# Patient Record
Sex: Female | Born: 1945 | Race: White | Hispanic: No | Marital: Married | Smoking: Never smoker
Health system: Southern US, Community
[De-identification: ages and names within clinical notes are randomized; demographics above are authoritative.]

## PROBLEM LIST (undated history)

## (undated) DIAGNOSIS — E039 Hypothyroidism, unspecified: Secondary | ICD-10-CM

## (undated) DIAGNOSIS — R112 Nausea with vomiting, unspecified: Secondary | ICD-10-CM

## (undated) DIAGNOSIS — M1990 Unspecified osteoarthritis, unspecified site: Secondary | ICD-10-CM

## (undated) DIAGNOSIS — K219 Gastro-esophageal reflux disease without esophagitis: Secondary | ICD-10-CM

## (undated) DIAGNOSIS — Z9889 Other specified postprocedural states: Secondary | ICD-10-CM

## (undated) HISTORY — PX: TOTAL THYROIDECTOMY: SHX2547

## (undated) HISTORY — PX: ENDOVENOUS ABLATION SAPHENOUS VEIN W/ LASER: SUR449

## (undated) HISTORY — PX: CHOLECYSTECTOMY: SHX55

## (undated) HISTORY — PX: ABDOMINAL HYSTERECTOMY: SHX81

## (undated) HISTORY — PX: RECTOCELE REPAIR: SHX761

## (undated) HISTORY — PX: SHOULDER ARTHROSCOPY W/ ROTATOR CUFF REPAIR: SHX2400

---

## 2019-04-28 ENCOUNTER — Encounter (INDEPENDENT_AMBULATORY_CARE_PROVIDER_SITE_OTHER): Payer: Self-pay | Admitting: *Deleted

## 2019-05-19 ENCOUNTER — Telehealth (INDEPENDENT_AMBULATORY_CARE_PROVIDER_SITE_OTHER): Payer: Self-pay | Admitting: *Deleted

## 2019-05-19 ENCOUNTER — Encounter (INDEPENDENT_AMBULATORY_CARE_PROVIDER_SITE_OTHER): Payer: Self-pay | Admitting: *Deleted

## 2019-05-19 ENCOUNTER — Encounter (INDEPENDENT_AMBULATORY_CARE_PROVIDER_SITE_OTHER): Payer: Self-pay | Admitting: Nurse Practitioner

## 2019-05-19 ENCOUNTER — Ambulatory Visit (INDEPENDENT_AMBULATORY_CARE_PROVIDER_SITE_OTHER): Payer: Medicare Other | Admitting: Nurse Practitioner

## 2019-05-19 ENCOUNTER — Other Ambulatory Visit: Payer: Self-pay

## 2019-05-19 VITALS — BP 136/85 | HR 88 | Temp 98.1°F | Ht 61.0 in | Wt 135.6 lb

## 2019-05-19 DIAGNOSIS — K219 Gastro-esophageal reflux disease without esophagitis: Secondary | ICD-10-CM | POA: Diagnosis not present

## 2019-05-19 DIAGNOSIS — R1031 Right lower quadrant pain: Secondary | ICD-10-CM | POA: Diagnosis not present

## 2019-05-19 DIAGNOSIS — Z86010 Personal history of colonic polyps: Secondary | ICD-10-CM

## 2019-05-19 DIAGNOSIS — R1310 Dysphagia, unspecified: Secondary | ICD-10-CM

## 2019-05-19 MED ORDER — PEG 3350-KCL-NA BICARB-NACL 420 G PO SOLR: 4000.0000 mL | Freq: Once | ORAL | 0 refills | Status: AC

## 2019-05-19 NOTE — Telephone Encounter (Signed)
Patient needs trilyte TCS sch'd 06/26/19

## 2019-05-19 NOTE — Progress Notes (Addendum)
   Subjective:    Patient ID: Sara Brady, female    DOB: [birth year], 73 y.o.   MRN: [id number]  HPI  Sara Brady is a 73 year old female with a past medical history of aortic regurgitation, HTN, hyperlipidemia, arthritis, chronic left knee pain, hypothyroidism s/p thyroidectomy 03/05/2016 secondary to benign thyroid nodules and colon polyps. Further surgical history includes a cholecystectomy in 2001, right rotator cuff surgery 2002, partial hysterectomy 1991, cystocele and rectocele repair 2017.  She presents today with complaints of RLQ abdominal pain that started 1 year ago. The RLQ pain is more noticeable over the past few months. She describes the pain as a dull ache. Passing a BM reduces this pain. Her bowel movements vary. She has constipation, takes a stool softener daily. She has diarrhea for a few days then no BM for 2 or 3 days then the cycle repeats. She tries to avoid straining which aggravates her bladder. No rectal bleeding or black stools. She complains of having acid regurgitation which is worse when she bends forward. Occasionally has difficulty swallowing pill. She has occasional pain in her esophagus when swallowing solid foods which occurs a few times weekly. Food does not get stuck in the esophagus.  She is taking meloxicam 15 mg once daily for left knee pain. She reports seeing a cardiologist 4 months ago, diagnosed with a mild leaky valve. She has occasional swelling to her legs. She has infrequent SOB with increased activity. No recent SOB. No cough. Chest xray 07/2018 negative. She is a nonsmoker. Infrequent alcohol use.  She reports having 4 colonoscopies in her lifetime. Her first colonoscopy at age 28 was normal. A 2nd colonoscopy was done 9 years later and a large polyp was removed. A repeat colonoscopy was done 1 year later and 1 small polyp was removed. Her  4th and most recent colonoscopy by Dr. Algis Greenhouse was 03/14/2013 which she reported was normal, no polyps.  Mother was  diagnosed with ulcerative colitis at the age of 77, she is living at age 62. Father died at the age of 59 with CAD, bladder cancer with mets.  Review of Systems  See HPI, all other systems reviewed and are negative     Objective:   Physical Exam  Blood pressure 136/85, pulse 88, temperature 98.1 F (36.7 C), height 5\' 1"  (1.549 m), weight 135 lb 9.6 oz (61.5 kg).  General: 73 year old female well-developed in no acute distress Eyes: Sclera nonicteric, conjunctiva pink Mouth: Dentition intact, no ulcers Neck: Supple, no thyromegaly or lymphadenopathy Heart: Regular rate and rhythm, no murmur Lungs: Breath sounds clear throughout Abdomen: Soft, mild tenderness to the right mid abdomen with fullness appreciated, no rebound or guarding, no organomegaly, positive bowel sounds to all 4 quadrants. RUQ scar.  Extremities: Trace lower extremity edema Neuro: Alert and oriented x4, no focal deficits     Assessment & Plan:  79.  73 year old female with acid reflux, intermittent pill dysphasia and odynophagia -Schedule EGD Increase Prevacid to 15 mg 1 tab twice daily  2.  Right lower quadrant abdominal pain with fullness palpated to the right mid abdomen on exam today, this area was mildly tender -Abdominal/pelvic CT with oral contrast only  3.  Constipation -Benefiber 1 tablespoon daily  4. History of colon polyps -Schedule a colonoscopy  Further follow-up to be determined after the above evaluation completed

## 2019-05-19 NOTE — Patient Instructions (Signed)
1. Schedule an abdominal/pelvic CT scan with oral contrast only  2. Schedule an EGD and colonoscopy   3. Increase Prevacid (Lansoprazole) 15 mg one tab twice daily   4. Benefiber 1 tablespoon once daily to regulate your bowel pattern   5. Further follow up to be determined after the above evaluation completed

## 2019-05-20 ENCOUNTER — Encounter (INDEPENDENT_AMBULATORY_CARE_PROVIDER_SITE_OTHER): Payer: Self-pay | Admitting: Nurse Practitioner

## 2019-05-20 DIAGNOSIS — Z86010 Personal history of colonic polyps: Secondary | ICD-10-CM | POA: Insufficient documentation

## 2019-05-20 DIAGNOSIS — E039 Hypothyroidism, unspecified: Secondary | ICD-10-CM | POA: Insufficient documentation

## 2019-06-01 ENCOUNTER — Other Ambulatory Visit: Payer: Self-pay

## 2019-06-01 ENCOUNTER — Ambulatory Visit (HOSPITAL_COMMUNITY)
Admission: RE | Admit: 2019-06-01 | Discharge: 2019-06-01 | Disposition: A | Discharge status: Home / Self Care | Payer: Medicare Other | Source: Ambulatory Visit | Attending: Nurse Practitioner | Admitting: Nurse Practitioner

## 2019-06-01 ENCOUNTER — Other Ambulatory Visit (INDEPENDENT_AMBULATORY_CARE_PROVIDER_SITE_OTHER): Payer: Self-pay | Admitting: Nurse Practitioner

## 2019-06-01 DIAGNOSIS — R1031 Right lower quadrant pain: Secondary | ICD-10-CM | POA: Diagnosis present

## 2019-06-22 ENCOUNTER — Encounter (HOSPITAL_COMMUNITY): Payer: Self-pay

## 2019-06-23 ENCOUNTER — Other Ambulatory Visit (HOSPITAL_COMMUNITY)
Admission: RE | Admit: 2019-06-23 | Discharge: 2019-06-23 | Disposition: A | Discharge status: Home / Self Care | Payer: Medicare Other | Source: Ambulatory Visit | Attending: Internal Medicine | Admitting: Internal Medicine

## 2019-06-23 ENCOUNTER — Encounter (HOSPITAL_COMMUNITY)
Admission: RE | Admit: 2019-06-23 | Discharge: 2019-06-23 | Disposition: A | Discharge status: Home / Self Care | Payer: Medicare Other | Source: Ambulatory Visit | Attending: Internal Medicine | Admitting: Internal Medicine

## 2019-06-23 DIAGNOSIS — Z20828 Contact with and (suspected) exposure to other viral communicable diseases: Secondary | ICD-10-CM | POA: Insufficient documentation

## 2019-06-23 DIAGNOSIS — Z01812 Encounter for preprocedural laboratory examination: Secondary | ICD-10-CM | POA: Insufficient documentation

## 2019-06-23 HISTORY — DX: Gastro-esophageal reflux disease without esophagitis: K21.9

## 2019-06-23 HISTORY — DX: Unspecified osteoarthritis, unspecified site: M19.90

## 2019-06-23 HISTORY — DX: Other specified postprocedural states: Z98.890

## 2019-06-23 HISTORY — DX: Hypothyroidism, unspecified: E03.9

## 2019-06-23 HISTORY — DX: Other specified postprocedural states: R11.2

## 2019-06-23 HISTORY — DX: Nausea with vomiting, unspecified: R11.2

## 2019-06-23 LAB — SARS CORONAVIRUS 2 (TAT 6-24 HRS): SARS Coronavirus 2: NEGATIVE

## 2019-06-26 ENCOUNTER — Ambulatory Visit (HOSPITAL_COMMUNITY)
Admission: RE | Admit: 2019-06-26 | Discharge: 2019-06-26 | Disposition: A | Discharge status: Home / Self Care | Payer: Medicare Other | Attending: Internal Medicine | Admitting: Internal Medicine

## 2019-06-26 ENCOUNTER — Ambulatory Visit (HOSPITAL_COMMUNITY): Payer: Medicare Other | Admitting: Anesthesiology

## 2019-06-26 ENCOUNTER — Encounter (HOSPITAL_COMMUNITY)
Admission: RE | Disposition: A | Discharge status: Home / Self Care | Payer: Self-pay | Source: Home / Self Care | Attending: Internal Medicine

## 2019-06-26 ENCOUNTER — Other Ambulatory Visit: Payer: Self-pay

## 2019-06-26 ENCOUNTER — Encounter (HOSPITAL_COMMUNITY): Payer: Self-pay

## 2019-06-26 DIAGNOSIS — E039 Hypothyroidism, unspecified: Secondary | ICD-10-CM | POA: Diagnosis not present

## 2019-06-26 DIAGNOSIS — Z1211 Encounter for screening for malignant neoplasm of colon: Secondary | ICD-10-CM | POA: Insufficient documentation

## 2019-06-26 DIAGNOSIS — M1990 Unspecified osteoarthritis, unspecified site: Secondary | ICD-10-CM | POA: Diagnosis not present

## 2019-06-26 DIAGNOSIS — K222 Esophageal obstruction: Secondary | ICD-10-CM

## 2019-06-26 DIAGNOSIS — K5900 Constipation, unspecified: Secondary | ICD-10-CM | POA: Diagnosis not present

## 2019-06-26 DIAGNOSIS — Z86010 Personal history of colonic polyps: Secondary | ICD-10-CM

## 2019-06-26 DIAGNOSIS — K644 Residual hemorrhoidal skin tags: Secondary | ICD-10-CM

## 2019-06-26 DIAGNOSIS — K449 Diaphragmatic hernia without obstruction or gangrene: Secondary | ICD-10-CM

## 2019-06-26 DIAGNOSIS — Z79899 Other long term (current) drug therapy: Secondary | ICD-10-CM | POA: Diagnosis not present

## 2019-06-26 DIAGNOSIS — R1314 Dysphagia, pharyngoesophageal phase: Secondary | ICD-10-CM | POA: Insufficient documentation

## 2019-06-26 DIAGNOSIS — Z09 Encounter for follow-up examination after completed treatment for conditions other than malignant neoplasm: Secondary | ICD-10-CM

## 2019-06-26 DIAGNOSIS — Z791 Long term (current) use of non-steroidal anti-inflammatories (NSAID): Secondary | ICD-10-CM | POA: Diagnosis not present

## 2019-06-26 DIAGNOSIS — K317 Polyp of stomach and duodenum: Secondary | ICD-10-CM

## 2019-06-26 DIAGNOSIS — Z79890 Hormone replacement therapy (postmenopausal): Secondary | ICD-10-CM | POA: Insufficient documentation

## 2019-06-26 DIAGNOSIS — K219 Gastro-esophageal reflux disease without esophagitis: Secondary | ICD-10-CM | POA: Insufficient documentation

## 2019-06-26 DIAGNOSIS — R1031 Right lower quadrant pain: Secondary | ICD-10-CM | POA: Insufficient documentation

## 2019-06-26 HISTORY — PX: ESOPHAGOGASTRODUODENOSCOPY (EGD) WITH PROPOFOL: SHX5813

## 2019-06-26 HISTORY — PX: COLONOSCOPY WITH PROPOFOL: SHX5780

## 2019-06-26 HISTORY — PX: POLYPECTOMY: SHX5525

## 2019-06-26 SURGERY — ESOPHAGOGASTRODUODENOSCOPY (EGD) WITH PROPOFOL
Anesthesia: General

## 2019-06-26 MED ORDER — LACTATED RINGERS IV SOLN
INTRAVENOUS | Status: DC
  Administered 2019-06-26: 08:00:00 via INTRAVENOUS

## 2019-06-26 MED ORDER — CHLORHEXIDINE GLUCONATE CLOTH 2 % EX PADS: 6.0000 | MEDICATED_PAD | Freq: Once | CUTANEOUS | Status: DC

## 2019-06-26 MED ORDER — PROPOFOL 10 MG/ML IV BOLUS
INTRAVENOUS | Status: DC | PRN
  Administered 2019-06-26 (×3): 20 mg via INTRAVENOUS

## 2019-06-26 MED ORDER — MIDAZOLAM HCL 2 MG/2ML IJ SOLN: 0.5000 mg | Freq: Once | INTRAMUSCULAR | Status: DC | PRN

## 2019-06-26 MED ORDER — PROPOFOL 10 MG/ML IV BOLUS
INTRAVENOUS | Status: AC
  Filled 2019-06-26: qty 40

## 2019-06-26 MED ORDER — PROMETHAZINE HCL 25 MG/ML IJ SOLN: 6.2500 mg | INTRAMUSCULAR | Status: DC | PRN

## 2019-06-26 MED ORDER — PROPOFOL 500 MG/50ML IV EMUL
INTRAVENOUS | Status: DC | PRN
  Administered 2019-06-26: 150 ug/kg/min via INTRAVENOUS

## 2019-06-26 NOTE — Transfer of Care (Signed)
Immediate Anesthesia Transfer of Care Note  Patient: Sara Brady  Procedure(s) Performed: ESOPHAGOGASTRODUODENOSCOPY (EGD) WITH PROPOFOL (N/A ) COLONOSCOPY WITH PROPOFOL (N/A ) POLYPECTOMY  Patient Location: PACU  Anesthesia Type:General  Level of Consciousness: awake  Airway & Oxygen Therapy: Patient Spontanous Breathing  Post-op Assessment: Report given to RN  Post vital signs: Reviewed  Last Vitals:  Vitals Value Taken Time  BP    Temp    Pulse 117 06/26/19 0848  Resp 15 06/26/19 0849  SpO2 80 % 06/26/19 0848  Vitals shown include unvalidated device data.  Last Pain:  Vitals:   06/26/19 0809  TempSrc:   PainSc: 3       Patients Stated Pain Goal: 7 (40/81/44 8185)  Complications: No apparent anesthesia complications

## 2019-06-26 NOTE — Op Note (Signed)
[facility] Patient Name: Sara Brady Procedure Date: 06/26/2019 7:44 AM MRN: [id number] Date of Birth: [birth year] Attending MD: Lionel December , MD CSN: [id number] Age: 73 Admit Type: Outpatient Procedure:                Colonoscopy Indications:              High risk colon cancer surveillance: Personal                            history of colonic polyps Providers:                Lionel December, MD, Loma Messing B. Patsy Lager, RN, Dyann Ruddle Referring MD:             Alinda Deem, MD Medicines:                Propofol per Anesthesia Complications:            No immediate complications. Estimated Blood Loss:     Estimated blood loss: none. Procedure:                Pre-Anesthesia Assessment:                           - Prior to the procedure, a History and Physical                            was performed, and patient medications and                            allergies were reviewed. The patient's tolerance of                            previous anesthesia was also reviewed. The risks                            and benefits of the procedure and the sedation                            options and risks were discussed with the patient.                            All questions were answered, and informed consent                            was obtained. Prior Anticoagulants: The patient has                            taken no previous anticoagulant or antiplatelet                            agents except for NSAID medication. ASA Grade  Assessment: II - A patient with mild systemic                            disease. After reviewing the risks and benefits,                            the patient was deemed in satisfactory condition to                            undergo the procedure.                           After obtaining informed consent, the colonoscope                            was passed under direct vision. Throughout the                             procedure, the patient's blood pressure, pulse, and                            oxygen saturations were monitored continuously. The                            [id number]-H190DL ([id number]) scope was introduced through                            the anus and advanced to the the cecum, identified                            by appendiceal orifice and ileocecal valve. The                            colonoscopy was performed without difficulty. The                            patient tolerated the procedure well. The quality                            of the bowel preparation was good. The ileocecal                            valve, appendiceal orifice, and rectum were                            photographed. Scope In: 8:28:52 AM Scope Out: 8:41:23 AM Scope Withdrawal Time: 0 hours 7 minutes 39 seconds  Total Procedure Duration: 0 hours 12 minutes 31 seconds  Findings:      Skin tags were found on perianal exam.      The colon (entire examined portion) appeared normal.      External hemorrhoids were found during retroflexion. The hemorrhoids       were small. Impression:               - Perianal skin tags  found on perianal exam.                           - The entire examined colon is normal.                           - External hemorrhoids.                           - No specimens collected. Moderate Sedation:      Per Anesthesia Care Recommendation:           - Patient has a contact number available for                            emergencies. The signs and symptoms of potential                            delayed complications were discussed with the                            patient. Return to normal activities tomorrow.                            Written discharge instructions were provided to the                            patient.                           - High fiber diet today.                           - Continue present medications.                           -  Repeat colonoscopy in 5 years for surveillance. Procedure Code(s):        --- Professional ---                           [phone], Colonoscopy, flexible; diagnostic, including                            collection of specimen(s) by brushing or washing,                            when performed (separate procedure) Diagnosis Code(s):        --- Professional ---                           K64.4, Residual hemorrhoidal skin tags                           Z86.010, Personal history of colonic polyps CPT copyright 2019 American Medical Association. All rights reserved. The codes documented in this report are preliminary and upon coder review may  be revised to meet current compliance requirements. Lionel DecemberNajeeb Jessejames Steelman, MD Ok AnisNajeeb  Laural Golden, MD 06/26/2019 8:56:24 AM This report has been signed electronically. Number of Addenda: 0

## 2019-06-26 NOTE — Discharge Instructions (Signed)
No aspirin or NSAIDs for 24 hours. Resume other medications as before.  Please consider not taking ibuprofen while you are on meloxicam/Mobic. High-fiber diet. No driving for 24 hours. Physician will call with biopsy results.   Colonoscopy, Adult, Care After This sheet gives you information about how to care for yourself after your procedure. Your doctor may also give you more specific instructions. If you have problems or questions, call your doctor. What can I expect after the procedure? After the procedure, it is common to have:  A small amount of blood in your poop for 24 hours.  Some gas.  Mild cramping or bloating in your belly. Follow these instructions at home: General instructions  For the first 24 hours after the procedure: ? Do not drive or use machinery. ? Do not sign important documents. ? Do not drink alcohol. ? Do your daily activities more slowly than normal. ? Eat foods that are soft and easy to digest.  Take over-the-counter or prescription medicines only as told by your doctor. To help cramping and bloating:   Try walking around.  Put heat on your belly (abdomen) as told by your doctor. Use a heat source that your doctor recommends, such as a moist heat pack or a heating pad. ? Put a towel between your skin and the heat source. ? Leave the heat on for 20-30 minutes. ? Remove the heat if your skin turns bright red. This is especially important if you cannot feel pain, heat, or cold. You can get burned. Eating and drinking   Drink enough fluid to keep your pee (urine) clear or pale yellow.  Return to your normal diet as told by your doctor. Avoid heavy or fried foods that are hard to digest.  Avoid drinking alcohol for as long as told by your doctor. Contact a doctor if:  You have blood in your poop (stool) 2-3 days after the procedure. Get help right away if:  You have more than a small amount of blood in your poop.  You see large clumps of tissue  (blood clots) in your poop.  Your belly is swollen.  You feel sick to your stomach (nauseous).  You throw up (vomit).  You have a fever.  You have belly pain that gets worse, and medicine does not help your pain. Summary  After the procedure, it is common to have a small amount of blood in your poop. You may also have mild cramping and bloating in your belly.  For the first 24 hours after the procedure, do not drive or use machinery, do not sign important documents, and do not drink alcohol.  Get help right away if you have a lot of blood in your poop, feel sick to your stomach, have a fever, or have more belly pain. This information is not intended to replace advice given to you by your health care provider. Make sure you discuss any questions you have with your health care provider. Document Released: 10/20/2010 Document Revised: 07/18/2017 Document Reviewed: 06/11/2016 Elsevier Patient Education  2020 [employer]   Monitored Anesthesia Care, Care After These instructions provide you with information about caring for yourself after your procedure. Your health care provider may also give you more specific instructions. Your treatment has been planned according to current medical practices, but problems sometimes occur. Call your health care provider if you have any problems or questions after your procedure. What can I expect after the procedure? After your procedure, you may:  Feel sleepy  for several hours.  Feel clumsy and have poor balance for several hours.  Feel forgetful about what happened after the procedure.  Have poor judgment for several hours.  Feel nauseous or vomit.  Have a sore throat if you had a breathing tube during the procedure. Follow these instructions at home: For at least 24 hours after the procedure:      Have a responsible adult stay with you. It is important to have someone help care for you until you are awake and alert.  Rest as  needed.  Do not: ? Participate in activities in which you could fall or become injured. ? Drive. ? Use heavy machinery. ? Drink alcohol. ? Take sleeping pills or medicines that cause drowsiness. ? Make important decisions or sign legal documents. ? Take care of children on your own. Eating and drinking  Follow the diet that is recommended by your health care provider.  If you vomit, drink water, juice, or soup when you can drink without vomiting.  Make sure you have little or no nausea before eating solid foods. General instructions  Take over-the-counter and prescription medicines only as told by your health care provider.  If you have sleep apnea, surgery and certain medicines can increase your risk for breathing problems. Follow instructions from your health care provider about wearing your sleep device: ? Anytime you are sleeping, including during daytime naps. ? While taking prescription pain medicines, sleeping medicines, or medicines that make you drowsy.  If you smoke, do not smoke without supervision.  Keep all follow-up visits as told by your health care provider. This is important. Contact a health care provider if:  You keep feeling nauseous or you keep vomiting.  You feel light-headed.  You develop a rash.  You have a fever. Get help right away if:  You have trouble breathing. Summary  For several hours after your procedure, you may feel sleepy and have poor judgment.  Have a responsible adult stay with you for at least 24 hours or until you are awake and alert. This information is not intended to replace advice given to you by your health care provider. Make sure you discuss any questions you have with your health care provider. Document Released: 01/08/2016 Document Revised: 12/16/2017 Document Reviewed: 01/08/2016 Elsevier Patient Education  2020 Elsevier Inc.    Upper Endoscopy, Adult, Care After This sheet gives you information about how to care  for yourself after your procedure. Your health care provider may also give you more specific instructions. If you have problems or questions, contact your health care provider. What can I expect after the procedure? After the procedure, it is common to have:  A sore throat.  Mild stomach pain or discomfort.  Bloating.  Nausea. Follow these instructions at home:   Follow instructions from your health care provider about what to eat or drink after your procedure.  Return to your normal activities as told by your health care provider. Ask your health care provider what activities are safe for you.  Take over-the-counter and prescription medicines only as told by your health care provider.  Do not drive for 24 hours if you were given a sedative during your procedure.  Keep all follow-up visits as told by your health care provider. This is important. Contact a health care provider if you have:  A sore throat that lasts longer than one day.  Trouble swallowing. Get help right away if:  You vomit blood or your vomit looks like coffee  grounds.  You have: ? A fever. ? Bloody, black, or tarry stools. ? A severe sore throat or you cannot swallow. ? Difficulty breathing. ? Severe pain in your chest or abdomen. Summary  After the procedure, it is common to have a sore throat, mild stomach discomfort, bloating, and nausea.  Do not drive for 24 hours if you were given a sedative during the procedure.  Follow instructions from your health care provider about what to eat or drink after your procedure.  Return to your normal activities as told by your health care provider. This information is not intended to replace advice given to you by your health care provider. Make sure you discuss any questions you have with your health care provider. Document Released: 03/18/2012 Document Revised: 03/11/2018 Document Reviewed: 02/17/2018 Elsevier Patient Education  2020 [employer].

## 2019-06-26 NOTE — Op Note (Signed)
[facility] Patient Name: Sara Brady Procedure Date: 06/26/2019 7:49 AM MRN: [id number] Date of Birth: 08-27-1946 Attending MD: Lionel December , MD CSN: [id number] Age: 73 Admit Type: Outpatient Procedure:                Upper GI endoscopy Indications:              Esophageal dysphagia, Odynophagia Providers:                Lionel December, MD, Brain Hilts RN, RN, Dyann Ruddle Referring MD:             Alinda Deem, MD Medicines:                Propofol per Anesthesia Complications:            No immediate complications. Estimated Blood Loss:     Estimated blood loss was minimal. Procedure:                Pre-Anesthesia Assessment:                           - Prior to the procedure, a History and Physical                            was performed, and patient medications and                            allergies were reviewed. The patient's tolerance of                            previous anesthesia was also reviewed. The risks                            and benefits of the procedure and the sedation                            options and risks were discussed with the patient.                            All questions were answered, and informed consent                            was obtained. Prior Anticoagulants: The patient has                            taken no previous anticoagulant or antiplatelet                            agents except for NSAID medication. ASA Grade                            Assessment: II - A patient with mild systemic                            disease. After reviewing the risks and benefits,  the patient was deemed in satisfactory condition to                            undergo the procedure.                           After obtaining informed consent, the endoscope was                            passed under direct vision. Throughout the                            procedure, the patient's blood pressure, pulse, and                       oxygen saturations were monitored continuously. The                            [id number]-[id number] ([id number] scope was introduced through the                            mouth, and advanced to the second part of duodenum.                            The upper GI endoscopy was accomplished without                            difficulty. The patient tolerated the procedure                            well. Scope In: 8:15:16 AM Scope Out: 8:24:43 AM Total Procedure Duration: 0 hours 9 minutes 27 seconds  Findings:      The examined esophagus was normal.      A mild Schatzki ring was found at the gastroesophageal junction. The       scope was withdrawn. Dilation was performed with a Maloney dilator with       no resistance at 54 Fr. The dilation site was examined following       endoscope reinsertion and showed no change and no bleeding, mucosal tear       or perforation. Ring was disrupted with focal biopsy.      A 4 cm hiatal hernia was present.      A single small sessile polyp with no stigmata of recent bleeding was       found in the gastric body. Biopsies were taken with a cold forceps for       histology. The pathology specimen was placed into Bottle Number 1.      The exam of the stomach was otherwise normal.      The duodenal bulb and second portion of the duodenum were normal. Impression:               - Normal esophagus.                           - Mild Schatzki ring. Dilated. Ring was stiill  intac; therefore disrupted with focal biopsy but                            this tissue was not saved.                           - 4 cm hiatal hernia.                           - A single gastric polyp. Biopsied.                           - Normal duodenal bulb and second portion of the                            duodenum. Moderate Sedation:      Per Anesthesia Care Recommendation:           - Patient has a contact number available for                             emergencies. The signs and symptoms of potential                            delayed complications were discussed with the                            patient. Return to normal activities tomorrow.                            Written discharge instructions were provided to the                            patient.                           - Resume previous diet today.                           - Continue present medications.                           - No aspirin, ibuprofen, naproxen, or other                            non-steroidal anti-inflammatory drugs for 1 day.                           - Await pathology results.                           - Telephone GI clinic in 1 week. Procedure Code(s):        --- Professional ---                           [phone], Esophagogastroduodenoscopy, flexible,  transoral; with biopsy, single or multiple                           43450, Dilation of esophagus, by unguided sound or                            bougie, single or multiple passes Diagnosis Code(s):        --- Professional ---                           K22.2, Esophageal obstruction                           K44.9, Diaphragmatic hernia without obstruction or                            gangrene                           K31.7, Polyp of stomach and duodenum                           R13.14, Dysphagia, pharyngoesophageal phase CPT copyright 2019 American Medical Association. All rights reserved. The codes documented in this report are preliminary and upon coder review may  be revised to meet current compliance requirements. Hildred Laser, MD Hildred Laser, MD 06/26/2019 8:52:51 AM This report has been signed electronically. Number of Addenda: 0

## 2019-06-26 NOTE — H&P (Signed)
Sara Brady is an 73 y.o. female.   Chief Complaint: Patient is here for cervical gastroduodenoscopy with esophageal dilation and colonoscopy. HPI: Patient is 73 year old Caucasian female with history of GERD who presents with over a year history of intermittent dysphagia to solids and pills particularly large pills.  She points to suprasternal area*bolus obstruction.  She also reports discomfort as the bolus of pills go down and may have epigastric pain for a short duration.  She has not had any episode of food impaction.  She also has a history of colonic polyps.  This is her fourth colonoscopy.  She had a large bleeding polyp removed but no polyps were found on her most recent colonoscopy of 2014.  She has constipation.  She also has left-sided abdominal pain felt to be due to constipation and her IBS.  She had abdominal pelvic CT recently which was unremarkable except stool in her colon. Family history significant for UC in her mother who was diagnosed at age 88 and now not doing well at 100 in a nursing home.  Past Medical History:  Diagnosis Date  . Arthritis   . GERD (gastroesophageal reflux disease)   . Hypothyroidism   . PONV (postoperative nausea and vomiting)     Past Surgical History:  Procedure Laterality Date  . ABDOMINAL HYSTERECTOMY    . CHOLECYSTECTOMY    . ENDOVENOUS ABLATION SAPHENOUS VEIN W/ LASER    . RECTOCELE REPAIR    . SHOULDER ARTHROSCOPY W/ ROTATOR CUFF REPAIR Right   . TOTAL THYROIDECTOMY      History reviewed. No pertinent family history. Social History:  reports that she has never smoked. She has never used smokeless tobacco. She reports current alcohol use of about 1.0 standard drinks of alcohol per week. She reports that she does not use drugs.  Allergies:  Allergies  Allergen Reactions  . Septra [Sulfamethoxazole-Trimethoprim] Shortness Of Breath and Rash  . Augmentin [Amoxicillin-Pot Clavulanate] Hives  . Codeine Nausea And Vomiting  . Levaquin  [Levofloxacin]   . Ceclor [Cefaclor] Rash    Medications Prior to Admission  Medication Sig Dispense Refill  . Cholecalciferol (VITAMIN D) 50 MCG (2000 UT) tablet Take 2,000 Units by mouth daily.    Marland Kitchen docusate sodium (COLACE) 100 MG capsule Take 100 mg by mouth daily.    . fexofenadine (ALLEGRA) 180 MG tablet Take 180 mg by mouth daily.    Marland Kitchen ibuprofen (ADVIL) 200 MG tablet Take 200 mg by mouth every 8 (eight) hours as needed (pain).    Marland Kitchen lansoprazole (PREVACID) 15 MG capsule Take 15 mg by mouth 2 (two) times daily before a meal.     . levothyroxine (SYNTHROID) 75 MCG tablet Take 75 mcg by mouth daily before breakfast.    . lovastatin (MEVACOR) 20 MG tablet Take 20 mg by mouth at bedtime.    . meloxicam (MOBIC) 15 MG tablet Take 7.5-15 mg by mouth daily.     . NONFORMULARY OR COMPOUNDED ITEM Place 1 application vaginally 2 (two) times a week. Estradiol cream; Vit E: 0.1% - 100 units    . Omega-3 Fatty Acids (FISH OIL) 1000 MG CAPS Take 1,000 mg by mouth 2 (two) times daily.    . vitamin C (ASCORBIC ACID) 500 MG tablet Take 500 mg by mouth daily.    . Zinc 25 MG TABS Take 25 mg by mouth daily.      No results found for this or any previous visit (from the past 48 hour(s)). No results found.  ROS  Blood pressure (!) 150/79, pulse 70, temperature 98.4 F (36.9 C), temperature source Oral, resp. rate 16, SpO2 95 %. Physical Exam  Constitutional:  Well-developed thin Caucasian female in NAD.  HENT:  Mouth/Throat: Oropharynx is clear and moist.  Eyes: Conjunctivae are normal. No scleral icterus.  Neck: No thyromegaly present.  Thyroid collar scar.  Cardiovascular: Normal rate, regular rhythm and normal heart sounds.  No murmur heard. Respiratory: Effort normal and breath sounds normal.  GI:  Abdomen is flat.  Laparoscopy scars noted.  Abdomen is soft and nontender with organomegaly or masses  Musculoskeletal:        General: No edema.  Lymphadenopathy:    She has no cervical  adenopathy.  Neurological: She is alert.  Skin: Skin is warm and dry.     Assessment/Plan Esophageal dysphagia. History of colonic polyps. Esophagogastroduodenoscopy with esophageal dilation followed by surveillance colonoscopy.  Lionel December, MD 06/26/2019, 8:02 AM

## 2019-06-26 NOTE — Anesthesia Preprocedure Evaluation (Signed)
Anesthesia Evaluation  Patient identified by MRN, date of birth, ID band Patient awake  General Assessment Comment:States personal h/o PONV remotely  States hasn't had any issues more recent ly   Reviewed: Allergy & Precautions, NPO status , Patient's Chart, lab work & pertinent test results  History of Anesthesia Complications (+) PONV and history of anesthetic complications  Airway Mallampati: I  TM Distance: >3 FB Neck ROM: Full    Dental no notable dental hx. (+) Teeth Intact   Pulmonary shortness of breath, at rest and lying,    Pulmonary exam normal breath sounds clear to auscultation       Cardiovascular Exercise Tolerance: Good negative cardio ROS Normal cardiovascular examI Rhythm:Regular Rate:Normal  Reports Good ET, prior to knee issues  Denies CP Reports SOB lying flat which improves with position change  States had a cardiac w/u this year-revealing mod Aortic insufficiency- No recent ecg or Echo in our system  Denies any CHF issues    Neuro/Psych negative neurological ROS  negative psych ROS   GI/Hepatic Neg liver ROS, GERD  Medicated and Controlled,  Endo/Other  Hypothyroidism   Renal/GU negative Renal ROS  negative genitourinary   Musculoskeletal  (+) Arthritis , Osteoarthritis,    Abdominal   Peds negative pediatric ROS (+)  Hematology negative hematology ROS (+)   Anesthesia Other Findings   Reproductive/Obstetrics negative OB ROS                             Anesthesia Physical Anesthesia Plan  ASA: II  Anesthesia Plan: General   Post-op Pain Management:    Induction: Intravenous  PONV Risk Score and Plan: 4 or greater and TIVA, Propofol infusion, Ondansetron, Dexamethasone and Treatment may vary due to age or medical condition  Airway Management Planned: Nasal Cannula and Simple Face Mask  Additional Equipment:   Intra-op Plan:   Post-operative Plan:    Informed Consent: I have reviewed the patients History and Physical, chart, labs and discussed the procedure including the risks, benefits and alternatives for the proposed anesthesia with the patient or authorized representative who has indicated his/her understanding and acceptance.     Dental advisory given  Plan Discussed with: CRNA  Anesthesia Plan Comments: (Plan Full PPE use  Plan GA with GETA as needed d/w pt -WTP with same after Q&A)        Anesthesia Quick Evaluation

## 2019-06-26 NOTE — Anesthesia Postprocedure Evaluation (Signed)
Anesthesia Post Note  Patient: Sara Brady  Procedure(s) Performed: ESOPHAGOGASTRODUODENOSCOPY (EGD) WITH PROPOFOL (N/A ) COLONOSCOPY WITH PROPOFOL (N/A ) POLYPECTOMY  Patient location during evaluation: PACU Anesthesia Type: General Level of consciousness: awake and alert and oriented Pain management: pain level controlled Vital Signs Assessment: post-procedure vital signs reviewed and stable Respiratory status: spontaneous breathing Cardiovascular status: blood pressure returned to baseline and stable Postop Assessment: no headache Anesthetic complications: no     Last Vitals:  Vitals:   06/26/19 0715  BP: (!) 150/79  Pulse: 70  Resp: 16  Temp: 36.9 C  SpO2: 95%    Last Pain:  Vitals:   06/26/19 0809  TempSrc:   PainSc: 3                  Lidiya Reise

## 2019-06-26 NOTE — Addendum Note (Signed)
Addendum  created 06/26/19 0911 by Ollen Bowl, CRNA   Charge Capture section accepted

## 2019-06-29 LAB — SURGICAL PATHOLOGY

## 2019-07-01 ENCOUNTER — Encounter (HOSPITAL_COMMUNITY): Payer: Self-pay | Admitting: Internal Medicine

## 2019-07-04 ENCOUNTER — Other Ambulatory Visit (INDEPENDENT_AMBULATORY_CARE_PROVIDER_SITE_OTHER): Payer: Self-pay | Admitting: Internal Medicine

## 2019-07-04 MED ORDER — LANSOPRAZOLE 30 MG PO CPDR: 30.0000 mg | DELAYED_RELEASE_CAPSULE | Freq: Every day | ORAL | 5 refills | Status: DC

## 2020-09-13 ENCOUNTER — Other Ambulatory Visit (INDEPENDENT_AMBULATORY_CARE_PROVIDER_SITE_OTHER): Payer: Self-pay

## 2020-09-13 ENCOUNTER — Telehealth (INDEPENDENT_AMBULATORY_CARE_PROVIDER_SITE_OTHER): Payer: Self-pay | Admitting: Internal Medicine

## 2020-09-13 MED ORDER — LANSOPRAZOLE 30 MG PO CPDR: 30.0000 mg | DELAYED_RELEASE_CAPSULE | Freq: Every day | ORAL | 1 refills | Status: AC

## 2020-09-13 NOTE — Telephone Encounter (Signed)
Per Dr. Karilyn Cota ok to send in the medication for 90 days worth and one refill,but patient will need an office visit for further refills, this was noted on the prescription and also patient was instructed that she will have to set up an office visit in order to get further refills.

## 2020-09-13 NOTE — Telephone Encounter (Signed)
Patient left message stating she needs a new prescription for a 90 day supply of her medication - didn't say what medication - ph# [phone]

## 2020-09-13 NOTE — Telephone Encounter (Signed)
I spoke with the patient and she wanted her rx for Lantoprazole sent to [facility] at [place] for a 90 day supply. I have placed a note on Dr. Patty Sermons desk asking if this was ok.

## 2020-10-13 IMAGING — CT CT ABDOMEN AND PELVIS WITHOUT CONTRAST
2 of 3 series · 16 of 46 positions shown, 18 images · non-contrast
Comparison: None.

CLINICAL DATA: Right lower and mid abdominal pain, question
diverticulitis

EXAM:
CT ABDOMEN AND PELVIS WITHOUT CONTRAST
TECHNIQUE: Multidetector CT imaging of the abdomen and pelvis was performed
following the standard protocol without IV contrast. Oral contrast
was received

[Series 2: axial st · axial · 0.60mm/px · z∈[-477,-147]mm · 13 of 76 slices shown, 15 images]
[im 5/76  soft-tissue]
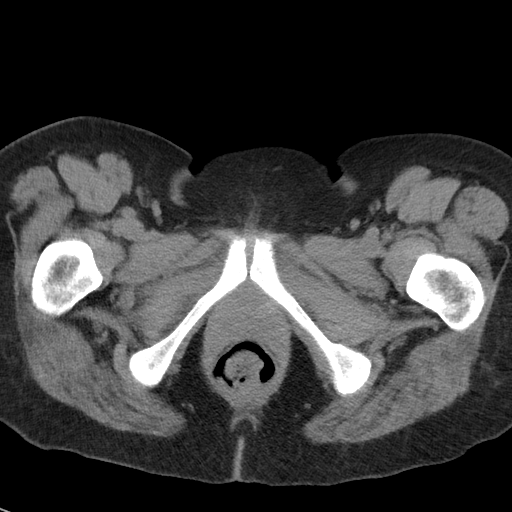
[im 5/76  bone]
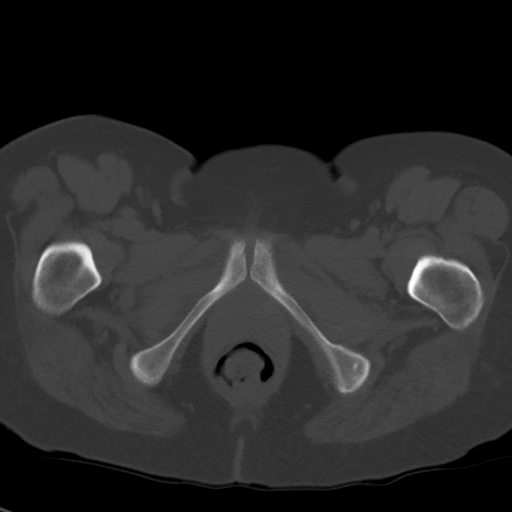
[im 10/76  soft-tissue]
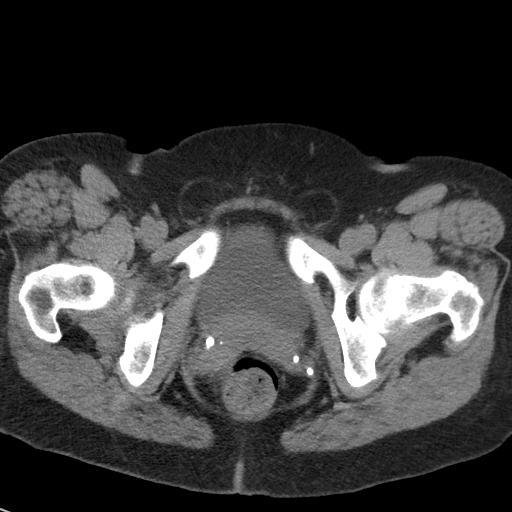
[im 15/76  soft-tissue]
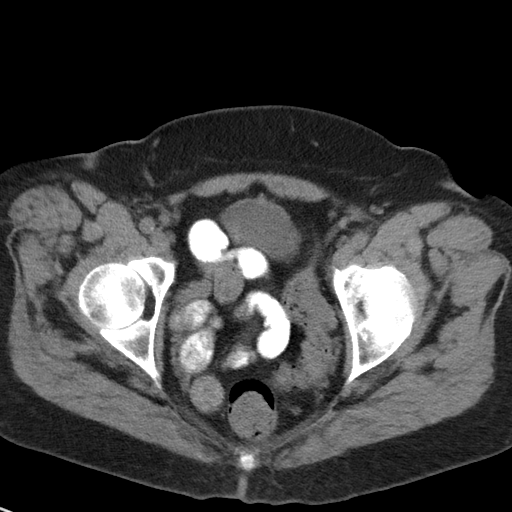
[im 22/76  soft-tissue]
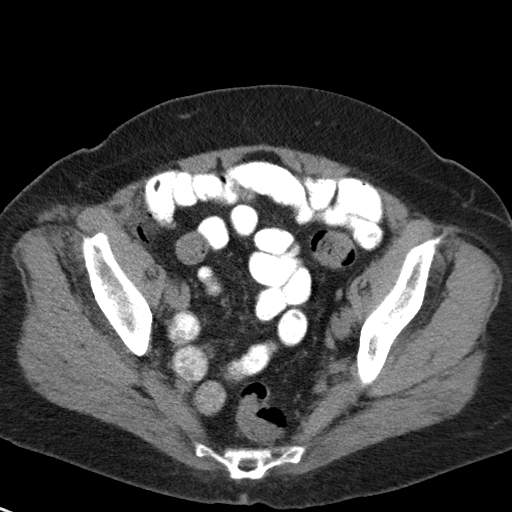
[im 27/76  soft-tissue]
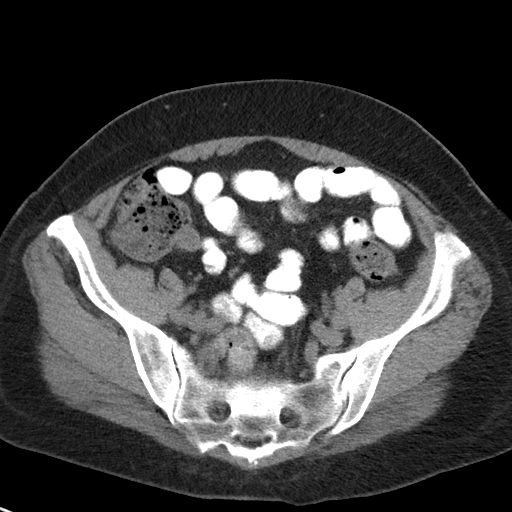
[im 32/76  soft-tissue]
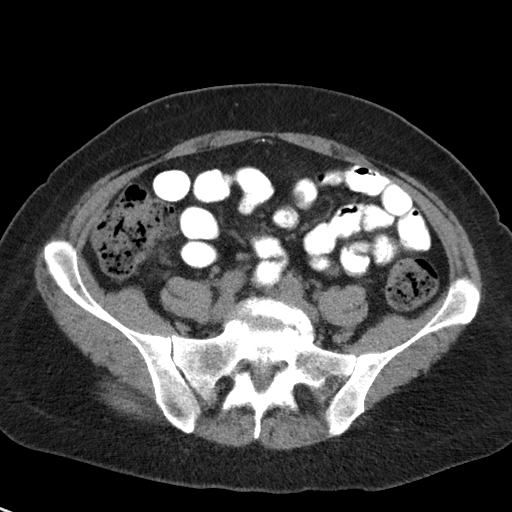
[im 39/76  soft-tissue]
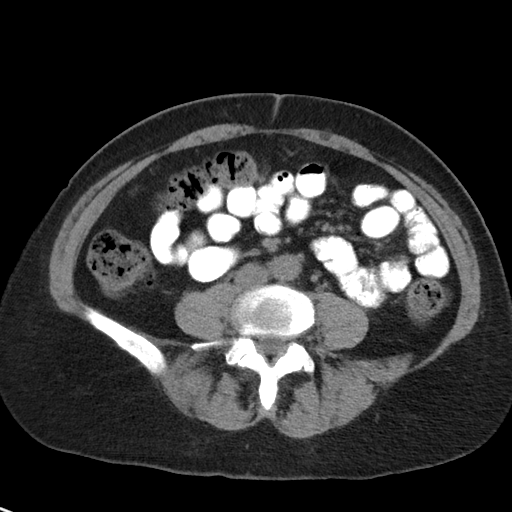
[im 44/76  soft-tissue]
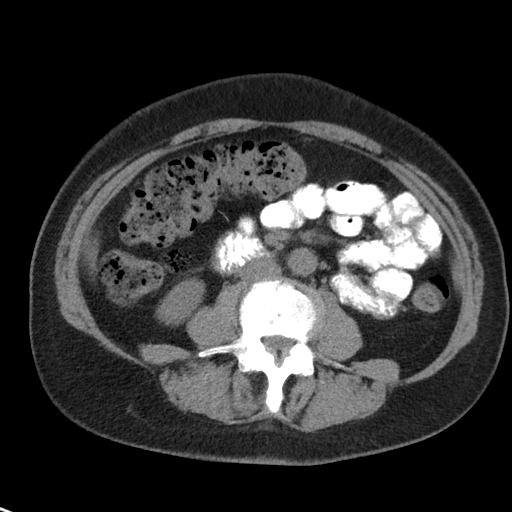
[im 49/76  soft-tissue]
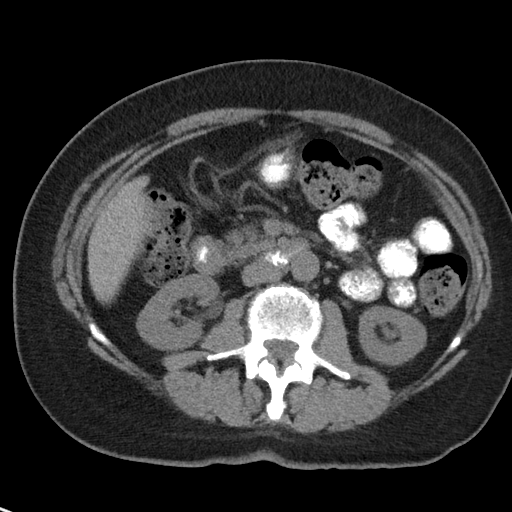
[im 49/76  bone]
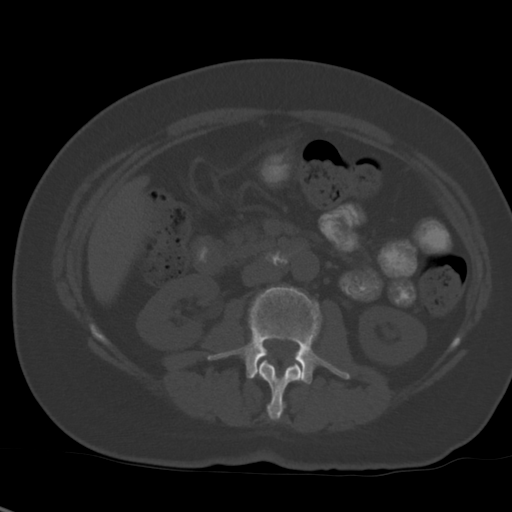
[im 54/76  soft-tissue]
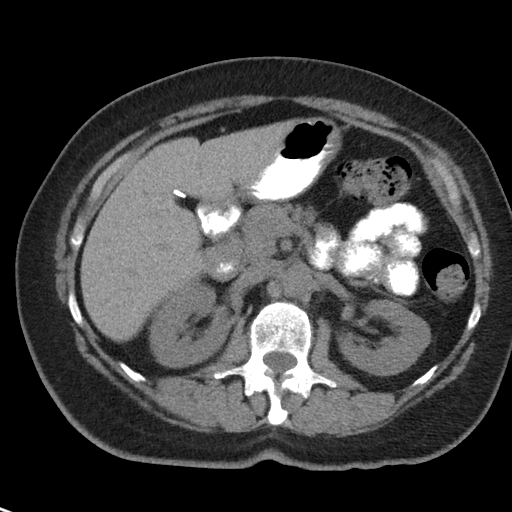
[im 61/76  soft-tissue]
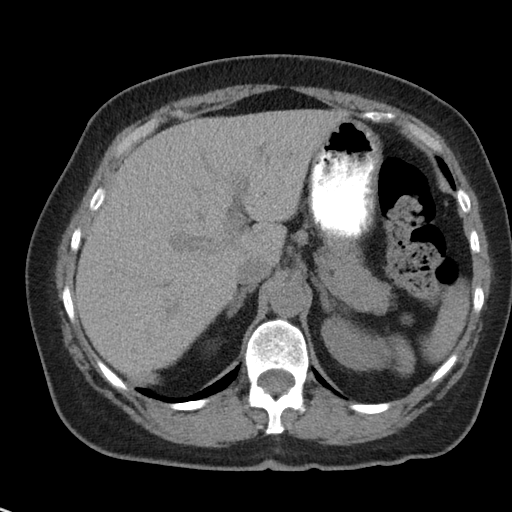
[im 66/76  soft-tissue]
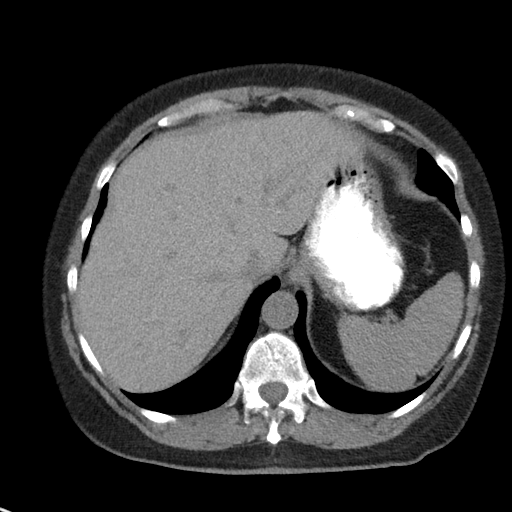
[im 71/76  soft-tissue]
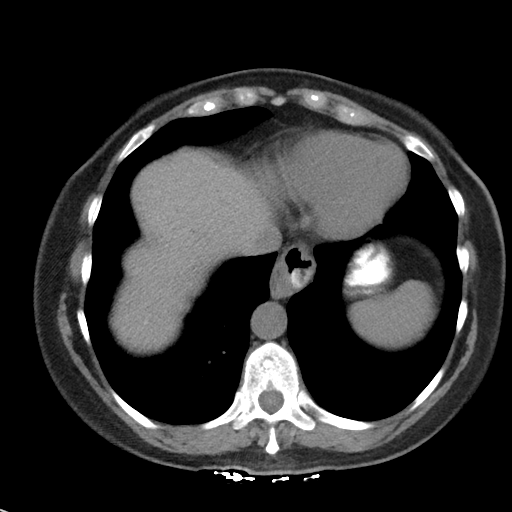

[Series 5: coronal st · coronal · 0.58mm/px · 3 of 78 slices shown]
[im 26/78  soft-tissue]
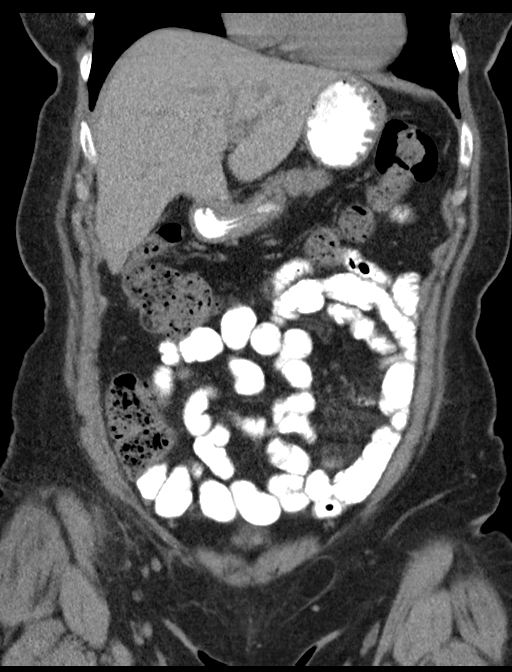
[im 35/78  soft-tissue]
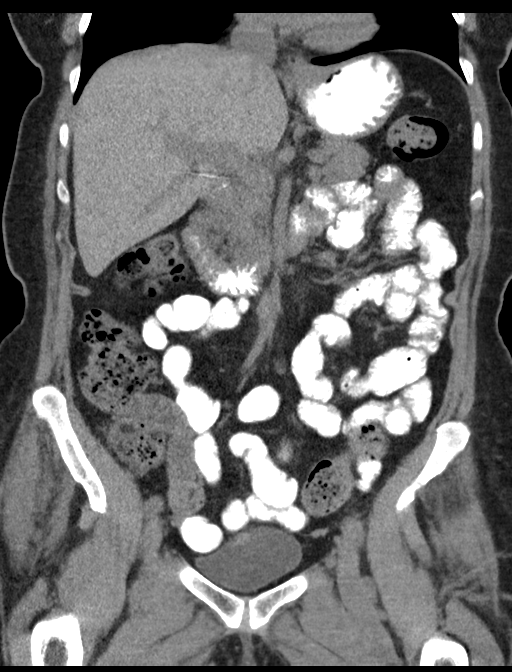
[im 43/78  soft-tissue]
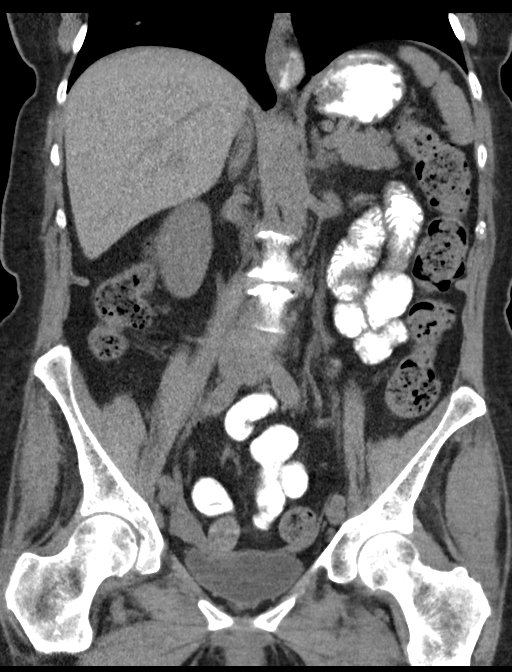

[16 of 46 positions shown; findings below may reference images not displayed]

FINDINGS: Lower chest: The visualized heart size within normal limits. No
pericardial fluid/thickening.

A small hiatal hernia is present.

The visualized portions of the lungs are clear.

Hepatobiliary: Although limited due to the lack of intravenous
contrast, normal in appearance without gross focal abnormality. The
patient is status post cholecystectomy. No biliary ductal dilation.

Pancreas:  Unremarkable.  No surrounding inflammatory changes.

Spleen: Normal in size. Although limited due to the lack of
intravenous contrast, normal in appearance.

Adrenals/Urinary Tract: Both adrenal glands appear normal. The
kidneys and collecting system appear normal without evidence of
urinary tract calculus or hydronephrosis. Bladder is unremarkable.

Stomach/Bowel: The stomach and small bowel are normal in appearance.
There is a moderate to large amount of colonic stool seen throughout
especially within the cecum. There appears to be a small amount of
fecalization of the terminal ileum. Air is seen in a nondilated
appendix. No surrounding pericolonic free fluid or inflammatory
changes are noted. No focal area of bowel wall thickening.

Vascular/Lymphatic: There are no enlarged abdominal or pelvic lymph
nodes. No significant gross vascular findings are present.

Reproductive: The patient is status post hysterectomy. No adnexal
masses or collections seen.

Other: No evidence of abdominal wall mass or hernia.

Musculoskeletal: No acute or significant osseous findings.
Degenerative changes are most notable L3-L4.
IMPRESSION: Moderate to large amount of colonic stool with fecalization of the
terminal ileum. No evidence of diverticulitis.

Small hiatal hernia.

## 2024-05-13 ENCOUNTER — Encounter (INDEPENDENT_AMBULATORY_CARE_PROVIDER_SITE_OTHER): Payer: Self-pay | Admitting: *Deleted

## 2024-05-27 ENCOUNTER — Telehealth: Payer: Self-pay | Admitting: *Deleted

## 2024-05-27 NOTE — Telephone Encounter (Signed)
 Pt left VM. Received TCS recall letter. She reports having dysphagia and thinks she might also need EGD at the same time. Can you schedule OV to discuss

## 2024-06-22 ENCOUNTER — Ambulatory Visit (INDEPENDENT_AMBULATORY_CARE_PROVIDER_SITE_OTHER): Admitting: Gastroenterology

## 2024-06-29 ENCOUNTER — Ambulatory Visit (INDEPENDENT_AMBULATORY_CARE_PROVIDER_SITE_OTHER): Admitting: Gastroenterology

## 2024-07-07 ENCOUNTER — Ambulatory Visit (INDEPENDENT_AMBULATORY_CARE_PROVIDER_SITE_OTHER): Admitting: Gastroenterology

## 2024-08-11 ENCOUNTER — Encounter (INDEPENDENT_AMBULATORY_CARE_PROVIDER_SITE_OTHER): Payer: Self-pay | Admitting: Gastroenterology

## 2024-10-07 ENCOUNTER — Ambulatory Visit (INDEPENDENT_AMBULATORY_CARE_PROVIDER_SITE_OTHER): Admitting: Gastroenterology

## 2024-10-07 ENCOUNTER — Encounter (INDEPENDENT_AMBULATORY_CARE_PROVIDER_SITE_OTHER): Payer: Self-pay | Admitting: Gastroenterology

## 2024-10-07 VITALS — BP 167/73 | HR 73 | Temp 98.2°F | Ht 60.0 in | Wt 132.1 lb

## 2024-10-07 DIAGNOSIS — R1310 Dysphagia, unspecified: Secondary | ICD-10-CM | POA: Diagnosis not present

## 2024-10-07 DIAGNOSIS — Z860100 Personal history of colon polyps, unspecified: Secondary | ICD-10-CM

## 2024-10-07 DIAGNOSIS — K219 Gastro-esophageal reflux disease without esophagitis: Secondary | ICD-10-CM | POA: Diagnosis not present

## 2024-10-07 DIAGNOSIS — R194 Change in bowel habit: Secondary | ICD-10-CM

## 2024-10-07 DIAGNOSIS — R1319 Other dysphagia: Secondary | ICD-10-CM | POA: Insufficient documentation

## 2024-10-07 DIAGNOSIS — R197 Diarrhea, unspecified: Secondary | ICD-10-CM | POA: Insufficient documentation

## 2024-10-07 DIAGNOSIS — K5904 Chronic idiopathic constipation: Secondary | ICD-10-CM | POA: Insufficient documentation

## 2024-10-07 DIAGNOSIS — I10 Essential (primary) hypertension: Secondary | ICD-10-CM | POA: Insufficient documentation

## 2024-10-07 MED ORDER — POLYETHYLENE GLYCOL 3350 17 G PO PACK: 17.0000 g | PACK | Freq: Two times a day (BID) | ORAL | 0 refills | Status: AC

## 2024-10-07 MED ORDER — PSYLLIUM 58.6 % PO PACK: 1.0000 | PACK | Freq: Two times a day (BID) | ORAL | 2 refills | Status: AC

## 2024-10-07 NOTE — Progress Notes (Signed)
 Lionel Woodberry Faizan Yuko Coventry , M.D. Gastroenterology & Hepatology [facility] [address] [place], [place] [address] Primary Care Physician: Diedra Senior, MD [address] [place] [address]  Chief Complaint: Dysphagia  History of Present Illness: Sara Brady is a 79 y.o. female with aortic regurgitation, HTN, hyperlipidemia, arthritis, chronic left knee pain, hypothyroidism s/p thyroidectomy  , history of large colon polyps removed who presents for evaluation of solid food dysphagia and colonoscopy with history of colon polyps  Patient was last seen in our clinic in 2020 as she was a patient of Dr. Golda , underwent upper endoscopy for dysphagia with dilation of Schatzki's ring and colonoscopy 2020  Patient reports that she will have solid food getting stuck in the middle of her chest and would go down with some pain.  Patient has been taking Prevacid  and if she does not take it she will have symptoms of GERD.  Reports after dilation in 2020 her symptoms significantly improved but recently have come back The patient denies having any nausea, vomiting, fever, chills, hematochezia, melena, hematemesis, abdominal distention, abdominal pain, diarrhea, jaundice, pruritus or weight loss.  Last EGD:06/2019  - Normal esophagus. - Mild Schatzki ring. Dilated. Ring was stiill intac; therefore disrupted with focal biopsy but this tissue was not saved. - 4 cm hiatal hernia. - A single gastric polyp. Biopsied. - Normal duodenal bulb and second portion of the duodenum.  Last Colonoscopy:06/2019 - Perianal skin tags found on perianal exam. - The entire examined colon is normal. - External hemorrhoids. - No specimens collected.  Repeat 5 years   FHx: Mother was diagnosed with ulcerative colitis age 20  Most recent labs 10/06/2024 normal liver enzymes hemoglobin 16 platelet 326  Past Medical History: Past Medical History:  Diagnosis Date   Arthritis    GERD  (gastroesophageal reflux disease)    Hypothyroidism    PONV (postoperative nausea and vomiting)     Past Surgical History: Past Surgical History:  Procedure Laterality Date   ABDOMINAL HYSTERECTOMY     CHOLECYSTECTOMY     COLONOSCOPY WITH PROPOFOL  N/A 06/26/2019   Procedure: COLONOSCOPY WITH PROPOFOL ;  Surgeon: Golda Claudis PENNER, MD;  [facility]: [facility];  Service: Endoscopy;  Laterality: N/A;   ENDOVENOUS ABLATION SAPHENOUS VEIN W/ LASER     ESOPHAGOGASTRODUODENOSCOPY (EGD) WITH PROPOFOL  N/A 06/26/2019   Procedure: ESOPHAGOGASTRODUODENOSCOPY (EGD) WITH PROPOFOL ;  Surgeon: Golda Claudis PENNER, MD;  Location: [facility];  Service: Endoscopy;  Laterality: N/A;  8:15   POLYPECTOMY  06/26/2019   Procedure: POLYPECTOMY;  Surgeon: Golda Claudis PENNER, MD;  Location: [facility];  Service: Endoscopy;;  gastric   RECTOCELE REPAIR     SHOULDER ARTHROSCOPY W/ ROTATOR CUFF REPAIR Right    TOTAL THYROIDECTOMY      Family History:History reviewed. No pertinent family history.  Social History:Tobacco Use History[1] Social History   Substance and Sexual Activity  Alcohol Use Yes   Alcohol/week: 1.0 standard drink of alcohol   Types: 1 Glasses of wine per week   Comment: one every    Social History   Substance and Sexual Activity  Drug Use Never    Allergies: Allergies[2]  Medications: Current Outpatient Medications  Medication Sig Dispense Refill   Cholecalciferol (VITAMIN D) 50 MCG (2000 UT) tablet Take 2,000 Units by mouth daily.     docusate sodium (COLACE) 100 MG capsule Take 100 mg by mouth daily.     fexofenadine (ALLEGRA) 180 MG tablet Take 180 mg by  mouth daily.     ibuprofen (ADVIL) 200 MG tablet Take 200 mg by mouth every 8 (eight) hours as needed (pain).     lansoprazole  (PREVACID ) 30 MG capsule Take 1 capsule (30 mg total) by mouth daily before breakfast. 90 capsule 1   levothyroxine (SYNTHROID) 75 MCG tablet Take 75 mcg by mouth daily before breakfast.     lovastatin  (MEVACOR) 20 MG tablet Take 20 mg by mouth at bedtime.     meloxicam (MOBIC) 15 MG tablet Take 0.5-1 tablets (7.5-15 mg total) by mouth daily.     NONFORMULARY OR COMPOUNDED ITEM Place 1 application vaginally 2 (two) times a week. Estradiol cream; Vit E: 0.1% - 100 units     Omega-3 Fatty Acids (FISH OIL) 1000 MG CAPS Take 1,000 mg by mouth 2 (two) times daily.     vitamin C (ASCORBIC ACID) 500 MG tablet Take 500 mg by mouth daily.     Zinc 25 MG TABS Take 25 mg by mouth daily.     No current facility-administered medications for this visit.    Review of Systems: GENERAL: negative for malaise, night sweats HEENT: No changes in hearing or vision, no nose bleeds or other nasal problems. NECK: Negative for lumps, goiter, pain and significant neck swelling RESPIRATORY: Negative for cough, wheezing CARDIOVASCULAR: Negative for chest pain, leg swelling, palpitations, orthopnea GI: SEE HPI MUSCULOSKELETAL: Negative for joint pain or swelling, back pain, and muscle pain. SKIN: Negative for lesions, rash HEMATOLOGY Negative for prolonged bleeding, bruising easily, and swollen nodes. ENDOCRINE: Negative for cold or heat intolerance, polyuria, polydipsia and goiter. NEURO: negative for tremor, gait imbalance, syncope and seizures. The remainder of the review of systems is noncontributory.   Physical Exam: BP (!) 167/73   Pulse 73   Temp 98.2 F (36.8 C)   Ht 5' (1.524 m)   Wt 132 lb 1.6 oz (59.9 kg)   BMI 25.80 kg/m  GENERAL: The patient is AO x3, in no acute distress. HEENT: Head is normocephalic and atraumatic. EOMI are intact. Mouth is well hydrated and without lesions. NECK: Supple. No masses LUNGS: Clear to auscultation. No presence of rhonchi/wheezing/rales. Adequate chest expansion HEART: RRR, normal s1 and s2. ABDOMEN: Soft, nontender, no guarding, no peritoneal signs, and nondistended. BS +. No masses.  Imaging/Labs: as above      No data to display         No results  found for: IRON, TIBC, FERRITIN  I personally reviewed and interpreted the available labs, imaging and endoscopic files.   Sara Brady is a 79 y.o. female with aortic regurgitation, HTN, hyperlipidemia, arthritis, chronic left knee pain, hypothyroidism s/p thyroidectomy  , history of large colon polyps removed who presents for evaluation of solid food dysphagia and colonoscopy with history of colon polyps  #Dysphagia #GERD This is likely recurrence of Schatzki's ring which was dilated in 2020 in setting of chronic GERD Patient had good response with dilation previously in 2020  Recommend upper endoscopy with dilation Continue PPI - Cut food in small pieces and chew food thoroughly to avoid regurgitation episodes. - Discussed avoidance of eating within 3 hours of lying down to sleep and benefit of blocks to elevate head of bed. Also, will benefit from avoiding carbonated drinks/sodas or food that has tomatoes, spicy or greasy food.   # Altered bowel movements  Patient bowel movement are between [facility] stool scale type VI and VII 3-4 times daily to type I and II.  Previous imaging showing  large stool burden this is likely overflow diarrhea with underlying constipation  Ensure adequate fluid intake: Aim for 8 glasses of water daily. Follow a high fiber diet: Include foods such as dates, prunes, pears, and kiwi. Take Miralax  twice a day for the first week, then reduce to once daily thereafter. Use Metamucil twice a day.  #History of colon polyps Patient has over 5 colonoscopies.  Previously had large polyp removed.  Last colonoscopy 2020 suggested repeat 5 years and she she is due now  Schedule colonoscopy along upper endoscopy  #HTN The patient was found to have elevated blood pressure when vital signs were checked in the office. The blood pressure was rechecked by the nursing staff and it was found be persistently elevated >140/90 mmHg. I personally advised to the patient to  follow up closely with PCP for hypertension control.   I thoroughly discussed with the patient the procedure, including the risks involved. Patient understands what the procedure involves including the benefits and any risks. Patient understands alternatives to the proposed procedure. Risks including (but not limited to) bleeding, tearing of the lining (perforation), rupture of adjacent organs, problems with heart and lung function, infection, and medication reactions. A small percentage of complications may require surgery, hospitalization, repeat endoscopic procedure, and/or transfusion.  Patient understood and agreed.     All questions were answered.      Jamare Vanatta Faizan Jasyah Theurer, MD Gastroenterology and Hepatology [facility]   This chart has been completed using [facility] Dictation software, and while attempts have been made to ensure accuracy , certain words and phrases may not be transcribed as intended      [1]  Social History Tobacco Use  Smoking Status Never  Smokeless Tobacco Never  [2]  Allergies Allergen Reactions   Septra [Sulfamethoxazole-Trimethoprim] Shortness Of Breath and Rash   Augmentin [Amoxicillin-Pot Clavulanate] Hives   Codeine Nausea And Vomiting   Levaquin [Levofloxacin]    Ceclor [Cefaclor] Rash

## 2024-10-07 NOTE — Patient Instructions (Signed)
It was very nice to meet you today, as dicussed with will plan for the following :  1) Ensure adequate fluid intake: Aim for 8 glasses of water daily. Follow a high fiber diet: Include foods such as dates, prunes, pears, and kiwi. Take Miralax twice a day for the first week, then reduce to once daily thereafter. Use Metamucil twice a day.   2) upper endoscopy and colonoscopy

## 2024-10-07 NOTE — H&P (View-Only) (Signed)
 Sara Brady Sara Brady , M.D. Gastroenterology & Hepatology [facility] [address] [place], [place] [address] Primary Care Physician: Diedra Senior, MD [address] [place] [address]  Chief Complaint: Dysphagia  History of Present Illness: Sara Brady is a 79 y.o. female with aortic regurgitation, HTN, hyperlipidemia, arthritis, chronic left knee pain, hypothyroidism s/p thyroidectomy  , history of large colon polyps removed who presents for evaluation of solid food dysphagia and colonoscopy with history of colon polyps  Patient was last seen in our clinic in 2020 as she was a patient of Dr. Golda , underwent upper endoscopy for dysphagia with dilation of Schatzki's ring and colonoscopy 2020  Patient reports that she will have solid food getting stuck in the middle of her chest and would go down with some pain.  Patient has been taking Prevacid  and if she does not take it she will have symptoms of GERD.  Reports after dilation in 2020 her symptoms significantly improved but recently have come back The patient denies having any nausea, vomiting, fever, chills, hematochezia, melena, hematemesis, abdominal distention, abdominal pain, diarrhea, jaundice, pruritus or weight loss.  Last EGD:06/2019  - Normal esophagus. - Mild Schatzki ring. Dilated. Ring was stiill intac; therefore disrupted with focal biopsy but this tissue was not saved. - 4 cm hiatal hernia. - A single gastric polyp. Biopsied. - Normal duodenal bulb and second portion of the duodenum.  Last Colonoscopy:06/2019 - Perianal skin tags found on perianal exam. - The entire examined colon is normal. - External hemorrhoids. - No specimens collected.  Repeat 5 years   FHx: Mother was diagnosed with ulcerative colitis age 20  Most recent labs 10/06/2024 normal liver enzymes hemoglobin 16 platelet 326  Past Medical History: Past Medical History:  Diagnosis Date   Arthritis    GERD  (gastroesophageal reflux disease)    Hypothyroidism    PONV (postoperative nausea and vomiting)     Past Surgical History: Past Surgical History:  Procedure Laterality Date   ABDOMINAL HYSTERECTOMY     CHOLECYSTECTOMY     COLONOSCOPY WITH PROPOFOL  N/A 06/26/2019   Procedure: COLONOSCOPY WITH PROPOFOL ;  Surgeon: Golda Claudis PENNER, MD;  [facility]: [facility];  Service: Endoscopy;  Laterality: N/A;   ENDOVENOUS ABLATION SAPHENOUS VEIN W/ LASER     ESOPHAGOGASTRODUODENOSCOPY (EGD) WITH PROPOFOL  N/A 06/26/2019   Procedure: ESOPHAGOGASTRODUODENOSCOPY (EGD) WITH PROPOFOL ;  Surgeon: Golda Claudis PENNER, MD;  Location: [facility];  Service: Endoscopy;  Laterality: N/A;  8:15   POLYPECTOMY  06/26/2019   Procedure: POLYPECTOMY;  Surgeon: Golda Claudis PENNER, MD;  Location: [facility];  Service: Endoscopy;;  gastric   RECTOCELE REPAIR     SHOULDER ARTHROSCOPY W/ ROTATOR CUFF REPAIR Right    TOTAL THYROIDECTOMY      Family History:History reviewed. No pertinent family history.  Social History:Tobacco Use History[1] Social History   Substance and Sexual Activity  Alcohol Use Yes   Alcohol/week: 1.0 standard drink of alcohol   Types: 1 Glasses of wine per week   Comment: one every    Social History   Substance and Sexual Activity  Drug Use Never    Allergies: Allergies[2]  Medications: Current Outpatient Medications  Medication Sig Dispense Refill   Cholecalciferol (VITAMIN D) 50 MCG (2000 UT) tablet Take 2,000 Units by mouth daily.     docusate sodium (COLACE) 100 MG capsule Take 100 mg by mouth daily.     fexofenadine (ALLEGRA) 180 MG tablet Take 180 mg by  mouth daily.     ibuprofen (ADVIL) 200 MG tablet Take 200 mg by mouth every 8 (eight) hours as needed (pain).     lansoprazole  (PREVACID ) 30 MG capsule Take 1 capsule (30 mg total) by mouth daily before breakfast. 90 capsule 1   levothyroxine (SYNTHROID) 75 MCG tablet Take 75 mcg by mouth daily before breakfast.     lovastatin  (MEVACOR) 20 MG tablet Take 20 mg by mouth at bedtime.     meloxicam (MOBIC) 15 MG tablet Take 0.5-1 tablets (7.5-15 mg total) by mouth daily.     NONFORMULARY OR COMPOUNDED ITEM Place 1 application vaginally 2 (two) times a week. Estradiol cream; Vit E: 0.1% - 100 units     Omega-3 Fatty Acids (FISH OIL) 1000 MG CAPS Take 1,000 mg by mouth 2 (two) times daily.     vitamin C (ASCORBIC ACID) 500 MG tablet Take 500 mg by mouth daily.     Zinc 25 MG TABS Take 25 mg by mouth daily.     No current facility-administered medications for this visit.    Review of Systems: GENERAL: negative for malaise, night sweats HEENT: No changes in hearing or vision, no nose bleeds or other nasal problems. NECK: Negative for lumps, goiter, pain and significant neck swelling RESPIRATORY: Negative for cough, wheezing CARDIOVASCULAR: Negative for chest pain, leg swelling, palpitations, orthopnea GI: SEE HPI MUSCULOSKELETAL: Negative for joint pain or swelling, back pain, and muscle pain. SKIN: Negative for lesions, rash HEMATOLOGY Negative for prolonged bleeding, bruising easily, and swollen nodes. ENDOCRINE: Negative for cold or heat intolerance, polyuria, polydipsia and goiter. NEURO: negative for tremor, gait imbalance, syncope and seizures. The remainder of the review of systems is noncontributory.   Physical Exam: BP (!) 167/73   Pulse 73   Temp 98.2 F (36.8 C)   Ht 5' (1.524 m)   Wt 132 lb 1.6 oz (59.9 kg)   BMI 25.80 kg/m  GENERAL: The patient is AO x3, in no acute distress. HEENT: Head is normocephalic and atraumatic. EOMI are intact. Mouth is well hydrated and without lesions. NECK: Supple. No masses LUNGS: Clear to auscultation. No presence of rhonchi/wheezing/rales. Adequate chest expansion HEART: RRR, normal s1 and s2. ABDOMEN: Soft, nontender, no guarding, no peritoneal signs, and nondistended. BS +. No masses.  Imaging/Labs: as above      No data to display         No results  found for: IRON, TIBC, FERRITIN  I personally reviewed and interpreted the available labs, imaging and endoscopic files.   Sara Brady is a 79 y.o. female with aortic regurgitation, HTN, hyperlipidemia, arthritis, chronic left knee pain, hypothyroidism s/p thyroidectomy  , history of large colon polyps removed who presents for evaluation of solid food dysphagia and colonoscopy with history of colon polyps  #Dysphagia #GERD This is likely recurrence of Schatzki's ring which was dilated in 2020 in setting of chronic GERD Patient had good response with dilation previously in 2020  Recommend upper endoscopy with dilation Continue PPI - Cut food in small pieces and chew food thoroughly to avoid regurgitation episodes. - Discussed avoidance of eating within 3 hours of lying down to sleep and benefit of blocks to elevate head of bed. Also, will benefit from avoiding carbonated drinks/sodas or food that has tomatoes, spicy or greasy food.   # Altered bowel movements  Patient bowel movement are between [facility] stool scale type VI and VII 3-4 times daily to type I and II.  Previous imaging showing  large stool burden this is likely overflow diarrhea with underlying constipation  Ensure adequate fluid intake: Aim for 8 glasses of water daily. Follow a high fiber diet: Include foods such as dates, prunes, pears, and kiwi. Take Miralax  twice a day for the first week, then reduce to once daily thereafter. Use Metamucil twice a day.  #History of colon polyps Patient has over 5 colonoscopies.  Previously had large polyp removed.  Last colonoscopy 2020 suggested repeat 5 years and she she is due now  Schedule colonoscopy along upper endoscopy  #HTN The patient was found to have elevated blood pressure when vital signs were checked in the office. The blood pressure was rechecked by the nursing staff and it was found be persistently elevated >140/90 mmHg. I personally advised to the patient to  follow up closely with PCP for hypertension control.   I thoroughly discussed with the patient the procedure, including the risks involved. Patient understands what the procedure involves including the benefits and any risks. Patient understands alternatives to the proposed procedure. Risks including (but not limited to) bleeding, tearing of the lining (perforation), rupture of adjacent organs, problems with heart and lung function, infection, and medication reactions. A small percentage of complications may require surgery, hospitalization, repeat endoscopic procedure, and/or transfusion.  Patient understood and agreed.     All questions were answered.      Jamare Vanatta Sara Jasyah Theurer, MD Gastroenterology and Hepatology [facility]   This chart has been completed using [facility] Dictation software, and while attempts have been made to ensure accuracy , certain words and phrases may not be transcribed as intended      [1]  Social History Tobacco Use  Smoking Status Never  Smokeless Tobacco Never  [2]  Allergies Allergen Reactions   Septra [Sulfamethoxazole-Trimethoprim] Shortness Of Breath and Rash   Augmentin [Amoxicillin-Pot Clavulanate] Hives   Codeine Nausea And Vomiting   Levaquin [Levofloxacin]    Ceclor [Cefaclor] Rash

## 2024-10-13 ENCOUNTER — Telehealth (INDEPENDENT_AMBULATORY_CARE_PROVIDER_SITE_OTHER): Payer: Self-pay

## 2024-10-13 MED ORDER — PEG 3350-KCL-NA BICARB-NACL 420 G PO SOLR: 4000.0000 mL | Freq: Once | ORAL | 0 refills | Status: AC

## 2024-10-13 NOTE — Telephone Encounter (Signed)
 Prior shara is not required

## 2024-10-13 NOTE — Telephone Encounter (Signed)
 Spoke with patient, scheduled TCS/EGD/DIL for 11/03/2024 at 11:00 am. Rx sent to pharmacy. Instructions mailed and sent to mychart.

## 2024-11-03 ENCOUNTER — Encounter (HOSPITAL_COMMUNITY)
Admission: RE | Disposition: A | Discharge status: Home / Self Care | Payer: Self-pay | Source: Home / Self Care | Attending: Gastroenterology

## 2024-11-03 ENCOUNTER — Ambulatory Visit (HOSPITAL_COMMUNITY): Admitting: Anesthesiology

## 2024-11-03 ENCOUNTER — Encounter (HOSPITAL_COMMUNITY): Payer: Self-pay | Admitting: Gastroenterology

## 2024-11-03 ENCOUNTER — Other Ambulatory Visit: Payer: Self-pay

## 2024-11-03 ENCOUNTER — Ambulatory Visit (HOSPITAL_COMMUNITY)
Admission: RE | Admit: 2024-11-03 | Discharge: 2024-11-03 | Disposition: A | Discharge status: Home / Self Care | Attending: Gastroenterology | Admitting: Gastroenterology

## 2024-11-03 DIAGNOSIS — K644 Residual hemorrhoidal skin tags: Secondary | ICD-10-CM | POA: Diagnosis not present

## 2024-11-03 DIAGNOSIS — K2970 Gastritis, unspecified, without bleeding: Secondary | ICD-10-CM

## 2024-11-03 DIAGNOSIS — Z860100 Personal history of colon polyps, unspecified: Secondary | ICD-10-CM

## 2024-11-03 DIAGNOSIS — D123 Benign neoplasm of transverse colon: Secondary | ICD-10-CM

## 2024-11-03 DIAGNOSIS — K222 Esophageal obstruction: Secondary | ICD-10-CM | POA: Diagnosis not present

## 2024-11-03 DIAGNOSIS — K648 Other hemorrhoids: Secondary | ICD-10-CM | POA: Insufficient documentation

## 2024-11-03 DIAGNOSIS — D122 Benign neoplasm of ascending colon: Secondary | ICD-10-CM

## 2024-11-03 DIAGNOSIS — I10 Essential (primary) hypertension: Secondary | ICD-10-CM | POA: Insufficient documentation

## 2024-11-03 DIAGNOSIS — Z8379 Family history of other diseases of the digestive system: Secondary | ICD-10-CM | POA: Insufficient documentation

## 2024-11-03 DIAGNOSIS — Z1211 Encounter for screening for malignant neoplasm of colon: Secondary | ICD-10-CM | POA: Diagnosis not present

## 2024-11-03 DIAGNOSIS — K5730 Diverticulosis of large intestine without perforation or abscess without bleeding: Secondary | ICD-10-CM | POA: Diagnosis not present

## 2024-11-03 DIAGNOSIS — K449 Diaphragmatic hernia without obstruction or gangrene: Secondary | ICD-10-CM | POA: Insufficient documentation

## 2024-11-03 DIAGNOSIS — K219 Gastro-esophageal reflux disease without esophagitis: Secondary | ICD-10-CM | POA: Insufficient documentation

## 2024-11-03 LAB — HM COLONOSCOPY

## 2024-11-03 MED ORDER — LACTATED RINGERS IV SOLN: INTRAVENOUS | Status: DC

## 2024-11-03 MED ORDER — PROPOFOL 10 MG/ML IV BOLUS
INTRAVENOUS | Status: DC | PRN
  Administered 2024-11-03: 60 mg via INTRAVENOUS
  Administered 2024-11-03: 200 ug/kg/min via INTRAVENOUS

## 2024-11-03 MED ORDER — LIDOCAINE 2% (20 MG/ML) 5 ML SYRINGE
INTRAMUSCULAR | Status: DC | PRN
  Administered 2024-11-03: 100 mg via INTRAVENOUS

## 2024-11-03 MED ORDER — PHENYLEPHRINE HCL-NACL 20-0.9 MG/250ML-% IV SOLN
INTRAVENOUS | Status: DC | PRN
  Administered 2024-11-03 (×6): 80 ug via INTRAVENOUS

## 2024-11-03 NOTE — Transfer of Care (Signed)
 Immediate Anesthesia Transfer of Care Note  Patient: Sara Brady  Procedure(s) Performed: COLONOSCOPY EGD (ESOPHAGOGASTRODUODENOSCOPY) DILATION, ESOPHAGUS POLYPECTOMY, INTESTINE  Patient Location: PACU  Anesthesia Type:MAC  Level of Consciousness: awake and alert   Airway & Oxygen Therapy: Patient Spontanous Breathing and Patient connected to nasal cannula oxygen  Post-op Assessment: Report given to RN, Post -op Vital signs reviewed and stable, and Patient moving all extremities X 4  Post vital signs: Reviewed  Last Vitals:  Vitals Value Taken Time  BP 98/45   Temp 36.9   Pulse 69   Resp 16   SpO2 100     Last Pain:  Vitals:   11/03/24 1018  TempSrc:   PainSc: 2       Patients Stated Pain Goal: 8 (11/03/24 0953)  Complications: There were no known notable events for this encounter.

## 2024-11-03 NOTE — Discharge Instructions (Signed)

## 2024-11-03 NOTE — Anesthesia Postprocedure Evaluation (Signed)
"   Anesthesia Post Note  Patient: Public House Manager  Procedure(s) Performed: COLONOSCOPY EGD (ESOPHAGOGASTRODUODENOSCOPY) DILATION, ESOPHAGUS POLYPECTOMY, INTESTINE  Patient location during evaluation: Phase II Anesthesia Type: MAC Level of consciousness: awake Pain management: pain level controlled Vital Signs Assessment: post-procedure vital signs reviewed and stable Respiratory status: spontaneous breathing and respiratory function stable Cardiovascular status: blood pressure returned to baseline and stable Postop Assessment: no headache and no apparent nausea or vomiting Anesthetic complications: no Comments: Late entry   There were no known notable events for this encounter.   Last Vitals:  Vitals:   11/03/24 1108 11/03/24 1112  BP: (!) 88/41 (!) 100/58  Pulse: (!) 108 82  Resp: (!) 21 18  Temp:    SpO2: 96% 96%    Last Pain:  Vitals:   11/03/24 1112  TempSrc:   PainSc: 0-No pain                 Yvonna PARAS Rayshard Schirtzinger      "

## 2024-11-03 NOTE — Op Note (Signed)
 [facility] Patient Name: Sara Brady Procedure Date: 11/03/2024 10:01 AM MRN: [id number] Date of Birth: December 25, 1945 Attending MD: Deatrice Dine , MD, [id number] CSN: [id number] Age: 79 Admit Type: Outpatient Procedure:                Upper GI endoscopy Indications:              Dysphagia Providers:                Deatrice Dine, MD, Rosina Sprague, Chad Wilson,                            Technician Referring MD:              Medicines:                Monitored Anesthesia Care Complications:            No immediate complications. Estimated Blood Loss:     Estimated blood loss was minimal. Procedure:                Pre-Anesthesia Assessment:                           - Prior to the procedure, a History and Physical                            was performed, and patient medications and                            allergies were reviewed. The patient's tolerance of                            previous anesthesia was also reviewed. The risks                            and benefits of the procedure and the sedation                            options and risks were discussed with the patient.                            All questions were answered, and informed consent                            was obtained. Prior Anticoagulants: The patient has                            taken no anticoagulant or antiplatelet agents                            except for aspirin. ASA Grade Assessment: II - A                            patient with mild systemic disease. After reviewing  the risks and benefits, the patient was deemed in                            satisfactory condition to undergo the procedure.                           After obtaining informed consent, the endoscope was                            passed under direct vision. Throughout the                            procedure, the patient's blood pressure, pulse, and                            oxygen saturations  were monitored continuously. The                            [id number] ([id number]) Upper was introduced through                            the mouth, and advanced to the second part of                            duodenum. The upper GI endoscopy was accomplished                            without difficulty. The patient tolerated the                            procedure well. Scope In: 10:26:37 AM Scope Out: 10:34:24 AM Total Procedure Duration: 0 hours 7 minutes 47 seconds  Findings:      A low-grade of narrowing Schatzki ring was found at the gastroesophageal       junction. A TTS dilator was passed through the scope. Dilation with an       18-19-20 mm balloon dilator was performed to 20 mm. The dilation site       was examined following endoscope reinsertion and showed moderate mucosal       disruption. Biopsies were obtained from the proximal and distal       esophagus with cold forceps for histology of suspected eosinophilic       esophagitis.      A 5 cm hiatal hernia was present.      Mild inflammation characterized by erythema was found in the gastric       antrum. Biopsies were taken with a cold forceps for histology.      The duodenal bulb and second portion of the duodenum were normal. Impression:               - Low-grade of narrowing Schatzki ring. Dilated.                           - 5 cm hiatal hernia.                           -  Gastritis. Biopsied.                           - Normal duodenal bulb and second portion of the                            duodenum.                           - Biopsies were taken with a cold forceps for                            evaluation of eosinophilic esophagitis. Moderate Sedation:      Per Anesthesia Care Recommendation:           - Patient has a contact number available for                            emergencies. The signs and symptoms of potential                            delayed complications were discussed with the                             patient. Return to normal activities tomorrow.                            Written discharge instructions were provided to the                            patient.                           - Resume previous diet.                           - Continue present medications.                           - Await pathology results.                           - Repeat upper endoscopy PRN.                           - Return to GI clinic as previously scheduled.                           -PPI daily Procedure Code(s):        --- Professional ---                           [id number], Esophagogastroduodenoscopy, flexible,                            transoral; with transendoscopic balloon dilation of  esophagus (less than 30 mm diameter)                           43239, 59, Esophagogastroduodenoscopy, flexible,                            transoral; with biopsy, single or multiple Diagnosis Code(s):        --- Professional ---                           K22.2, Esophageal obstruction                           K44.9, Diaphragmatic hernia without obstruction or                            gangrene                           K29.70, Gastritis, unspecified, without bleeding                           R13.10, Dysphagia, unspecified CPT copyright 2022 American Medical Association. All rights reserved. The codes documented in this report are preliminary and upon coder review may  be revised to meet current compliance requirements. Deatrice Dine, MD Deatrice Dine, MD 11/03/2024 11:09:15 AM This report has been signed electronically. Number of Addenda: 0

## 2024-11-04 ENCOUNTER — Encounter (INDEPENDENT_AMBULATORY_CARE_PROVIDER_SITE_OTHER): Payer: Self-pay | Admitting: *Deleted

## 2024-11-04 ENCOUNTER — Telehealth (INDEPENDENT_AMBULATORY_CARE_PROVIDER_SITE_OTHER): Payer: Self-pay | Admitting: *Deleted

## 2024-11-04 LAB — SURGICAL PATHOLOGY

## 2024-11-04 NOTE — Telephone Encounter (Signed)
-----   Message from Deatrice Dine, MD sent at 11/04/2024  8:58 AM EST ----- Regarding: Reff Hi Sara Brady ,  Can you please arrange a referral for this patient to Colorectal surgeon  Diagnosis: Perianal skin tags and hemorrhoids , affecting quality of life     Thanks,  Muhammad Faizan Ahmed, MD Gastroenterology and Hepatology [facility]

## 2024-11-04 NOTE — Telephone Encounter (Signed)
 Referral sent, they will contact patient with apt
